# Patient Record
Sex: Male | Born: 1990 | Race: White | Hispanic: No | Marital: Single | Smoking: Former smoker
Health system: Southern US, Community
[De-identification: ages and names within clinical notes are randomized; demographics above are authoritative.]

## PROBLEM LIST (undated history)

## (undated) DIAGNOSIS — M5136 Other intervertebral disc degeneration, lumbar region: Secondary | ICD-10-CM

## (undated) HISTORY — PX: WISDOM TOOTH EXTRACTION: SHX21

---
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
## 2017-05-13 ENCOUNTER — Other Ambulatory Visit: Payer: Self-pay

## 2017-05-13 ENCOUNTER — Emergency Department (HOSPITAL_COMMUNITY)
Admission: EM | Admit: 2017-05-13 | Discharge: 2017-05-14 | Disposition: A | Discharge status: Home / Self Care | Payer: Self-pay | Attending: Emergency Medicine | Admitting: Emergency Medicine

## 2017-05-13 ENCOUNTER — Encounter (HOSPITAL_COMMUNITY): Payer: Self-pay | Admitting: Student

## 2017-05-13 DIAGNOSIS — Z87891 Personal history of nicotine dependence: Secondary | ICD-10-CM | POA: Insufficient documentation

## 2017-05-13 DIAGNOSIS — Y9389 Activity, other specified: Secondary | ICD-10-CM | POA: Insufficient documentation

## 2017-05-13 DIAGNOSIS — X509XXA Other and unspecified overexertion or strenuous movements or postures, initial encounter: Secondary | ICD-10-CM | POA: Insufficient documentation

## 2017-05-13 DIAGNOSIS — Y92017 Garden or yard in single-family (private) house as the place of occurrence of the external cause: Secondary | ICD-10-CM | POA: Insufficient documentation

## 2017-05-13 DIAGNOSIS — Y999 Unspecified external cause status: Secondary | ICD-10-CM | POA: Insufficient documentation

## 2017-05-13 DIAGNOSIS — S93402A Sprain of unspecified ligament of left ankle, initial encounter: Secondary | ICD-10-CM | POA: Insufficient documentation

## 2017-05-13 HISTORY — DX: Other intervertebral disc degeneration, lumbar region: M51.36

## 2017-05-13 NOTE — ED Triage Notes (Signed)
Pt c/o left ankle pain that started tonight after jumped over a lawn mower and landed wrong. Felt a pop.

## 2017-05-14 ENCOUNTER — Emergency Department (HOSPITAL_COMMUNITY): Payer: Self-pay

## 2017-05-14 MED ORDER — OXYCODONE-ACETAMINOPHEN 5-325 MG PO TABS
1.0000 | ORAL_TABLET | Freq: Once | ORAL | Status: AC
  Administered 2017-05-14: 1 via ORAL
  Filled 2017-05-14: qty 1

## 2017-05-14 MED ORDER — IBUPROFEN 800 MG PO TABS
ORAL_TABLET | ORAL | Status: AC
  Filled 2017-05-14: qty 1

## 2017-05-14 MED ORDER — IBUPROFEN 800 MG PO TABS: 800.0000 mg | ORAL_TABLET | Freq: Three times a day (TID) | ORAL | 0 refills | Status: AC | PRN

## 2017-05-14 MED ORDER — IBUPROFEN 400 MG PO TABS
400.0000 mg | ORAL_TABLET | Freq: Once | ORAL | Status: AC
  Administered 2017-05-14: 400 mg via ORAL

## 2017-05-14 NOTE — ED Provider Notes (Signed)
[facility] EMERGENCY DEPARTMENT Provider Note   CSN: [id number] Arrival date & time: 05/13/17  2343     History   Chief Complaint Chief Complaint  Patient presents with  . Ankle Pain    HPI Manuel Reid is a 27 y.o. male.  Patient presents with left ankle pain and foot pain after trying to jump over a riding lawnmower landing wrong.  His ankle inverted.  This happened around 4 PM.  He has been struggling to walk since.  He is not taking any medication for it.  Denies any other injury.  No head, neck, back or chest pain.  No numbness, weakness, tingling.  No open wounds.   The history is provided by the patient.  Ankle Pain      Past Medical History:  Diagnosis Date  . DDD (degenerative disc disease), lumbar     There are no active problems to display for this patient.   Past Surgical History:  Procedure Laterality Date  . WISDOM TOOTH EXTRACTION         Home Medications    Prior to Admission medications   Not on File    Family History History reviewed. No pertinent family history.  Social History Social History   Tobacco Use  . Smoking status: Former Games developer  . Smokeless tobacco: Never Used  Substance Use Topics  . Alcohol use: Yes    Frequency: Never    Comment: OCC  . Drug use: Yes    Types: Marijuana    Comment: OCC     Allergies   Patient has no allergy information on record.   Review of Systems Review of Systems  Constitutional: Negative for activity change, appetite change and fever.  HENT: Negative for congestion and rhinorrhea.   Respiratory: Negative for chest tightness.   Cardiovascular: Negative for chest pain.  Gastrointestinal: Negative for abdominal pain, nausea and vomiting.  Genitourinary: Negative for discharge and dysuria.  Musculoskeletal: Positive for arthralgias and myalgias. Negative for back pain and neck pain.  Skin: Negative for rash.  Neurological: Negative for dizziness, weakness and headaches.    all other  systems are negative except as noted in the HPI and PMH.    Physical Exam Updated Vital Signs BP 117/71 (BP Location: Right Arm)   Pulse 100   Temp 98.3 F (36.8 C) (Oral)   Resp 16   Ht 5\' 9"  (1.753 m)   Wt 97.5 kg (215 lb)   SpO2 99%   BMI 31.75 kg/m   Physical Exam  Constitutional: He is oriented to person, place, and time. He appears well-developed and well-nourished. No distress.  HENT:  Head: Normocephalic and atraumatic.  Mouth/Throat: Oropharynx is clear and moist. No oropharyngeal exudate.  Eyes: Conjunctivae and EOM are normal. Pupils are equal, round, and reactive to light.  Neck: Normal range of motion. Neck supple.  No meningismus.  Cardiovascular: Normal rate, regular rhythm, normal heart sounds and intact distal pulses.  No murmur heard. Pulmonary/Chest: Effort normal and breath sounds normal. No respiratory distress.  Abdominal: Soft. There is no tenderness. There is no rebound and no guarding.  Musculoskeletal: He exhibits edema and tenderness.  Diffuse swelling of L ankle, TTP medial and lateral malleoli Intact DP and PT pulse No proximal fibula tenderness Achilles intact.  Reduced ROM of ankle joint, able to wiggle toes  Neurological: He is alert and oriented to person, place, and time. No cranial nerve deficit. He exhibits normal muscle tone. Coordination normal.  No ataxia on  finger to nose bilaterally. No pronator drift. 5/5 strength throughout. CN 2-12 intact.Equal grip strength. Sensation intact.   Skin: Skin is warm.  Psychiatric: He has a normal mood and affect. His behavior is normal.  Nursing note and vitals reviewed.    ED Treatments / Results  Labs (all labs ordered are listed, but only abnormal results are displayed) Labs Reviewed - No data to display  EKG  EKG Interpretation None       Radiology Dg Ankle Complete Left  Result Date: 05/14/2017 CLINICAL DATA:  27 y/o M; rolling injury of left ankle with severe pain. EXAM: LEFT  ANKLE COMPLETE - 3+ VIEW COMPARISON:  None. FINDINGS: There is no evidence of fracture, dislocation, or joint effusion. There is no evidence of arthropathy or other focal bone abnormality. Soft tissues are unremarkable. IMPRESSION: Negative. Electronically Signed   By: Mitzi HansenLance  Furusawa-Stratton M.D.   On: 05/14/2017 01:01   Dg Foot Complete Left  Result Date: 05/14/2017 CLINICAL DATA:  27 y/o  M; rolling injury of left ankle with pain. EXAM: LEFT FOOT - COMPLETE 3+ VIEW COMPARISON:  None. FINDINGS: There is no evidence of fracture or dislocation. There is no evidence of arthropathy or other focal bone abnormality. Soft tissues are unremarkable. IMPRESSION: Negative. Electronically Signed   By: Mitzi HansenLance  Furusawa-Stratton M.D.   On: 05/14/2017 01:02    Procedures Procedures (including critical care time)  Medications Ordered in ED Medications  ibuprofen (ADVIL,MOTRIN) 800 MG tablet (not administered)  oxyCODONE-acetaminophen (PERCOCET/ROXICET) 5-325 MG per tablet 1 tablet (1 tablet Oral Given 05/14/17 0011)  ibuprofen (ADVIL,MOTRIN) tablet 400 mg (400 mg Oral Given 05/14/17 0011)     Initial Impression / Assessment and Plan / ED Course  I have reviewed the triage vital signs and the nursing notes.  Pertinent labs & imaging results that were available during my care of the patient were reviewed by me and considered in my medical decision making (see chart for details).    Left ankle pain after jumping over a lawnmower and inverting his ankle.  No other injury.  Neurovascularly intact.  X-rays are negative for fracture.  Patient given ice and anti-inflammatories.  Will give ASO and crutches.  Discussed rice, NSAIDs, orthopedic follow-up for ankle sprain.  Return precautions discussed. Final Clinical Impressions(s) / ED Diagnoses   Final diagnoses:  Sprain of left ankle, unspecified ligament, initial encounter    ED Discharge Orders    None       Tiyonna Sardinha, Jeannett SeniorStephen, MD 05/14/17 0207

## 2019-08-29 IMAGING — DX DG FOOT COMPLETE 3+V*L*
3 series · 3 of 3 positions shown · non-contrast
Comparison: None.

CLINICAL DATA: 26 y/o  M; rolling injury of left ankle with pain.

EXAM:
LEFT FOOT - COMPLETE 3+ VIEW

[foot ap]
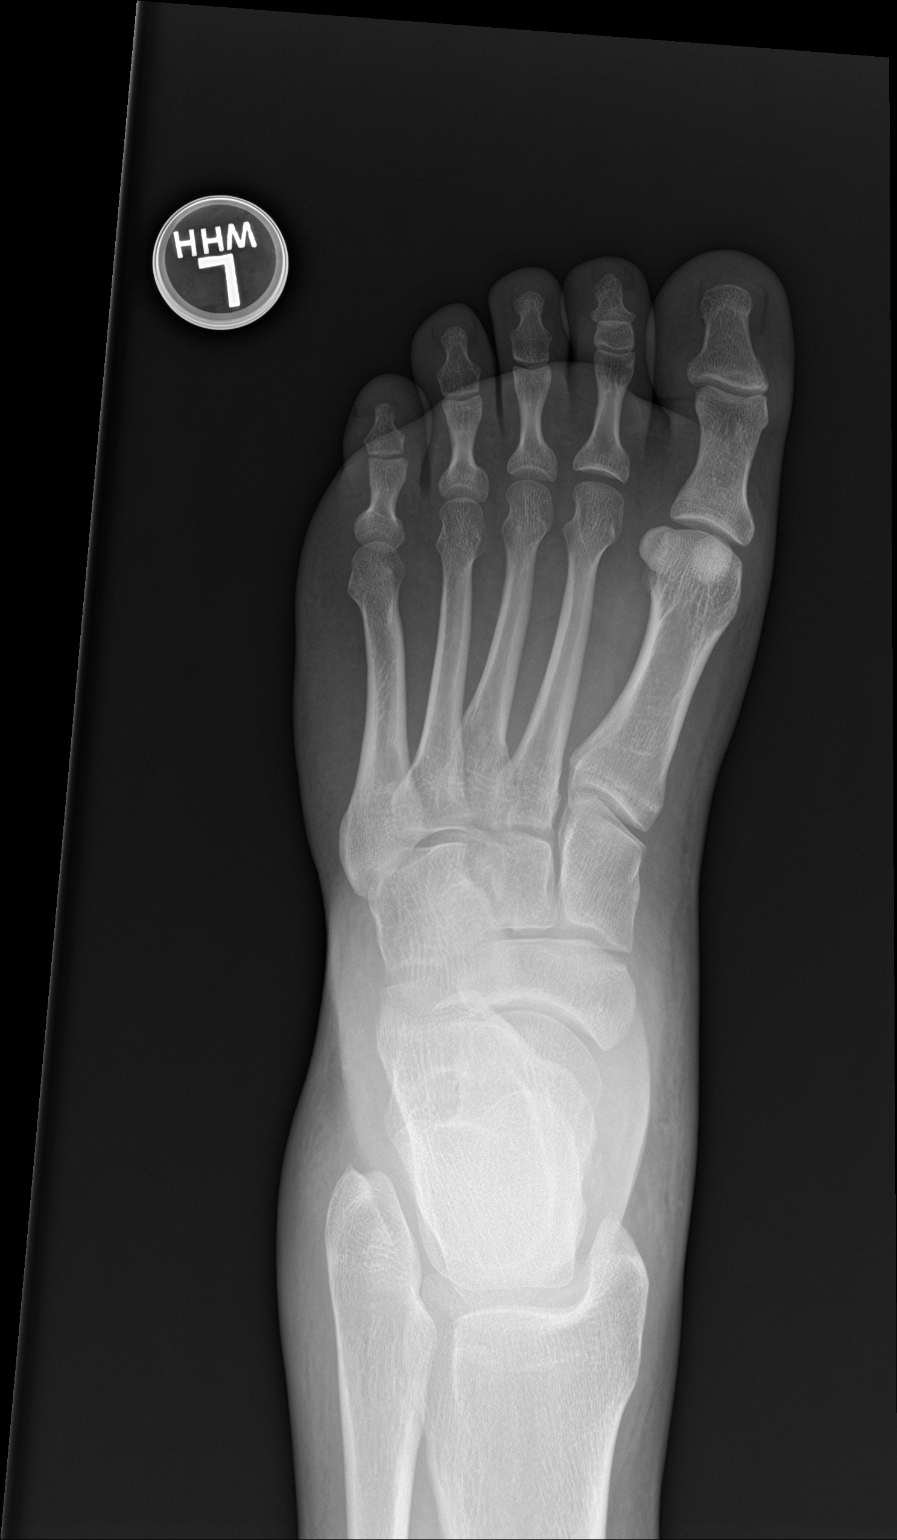

[foot obl]
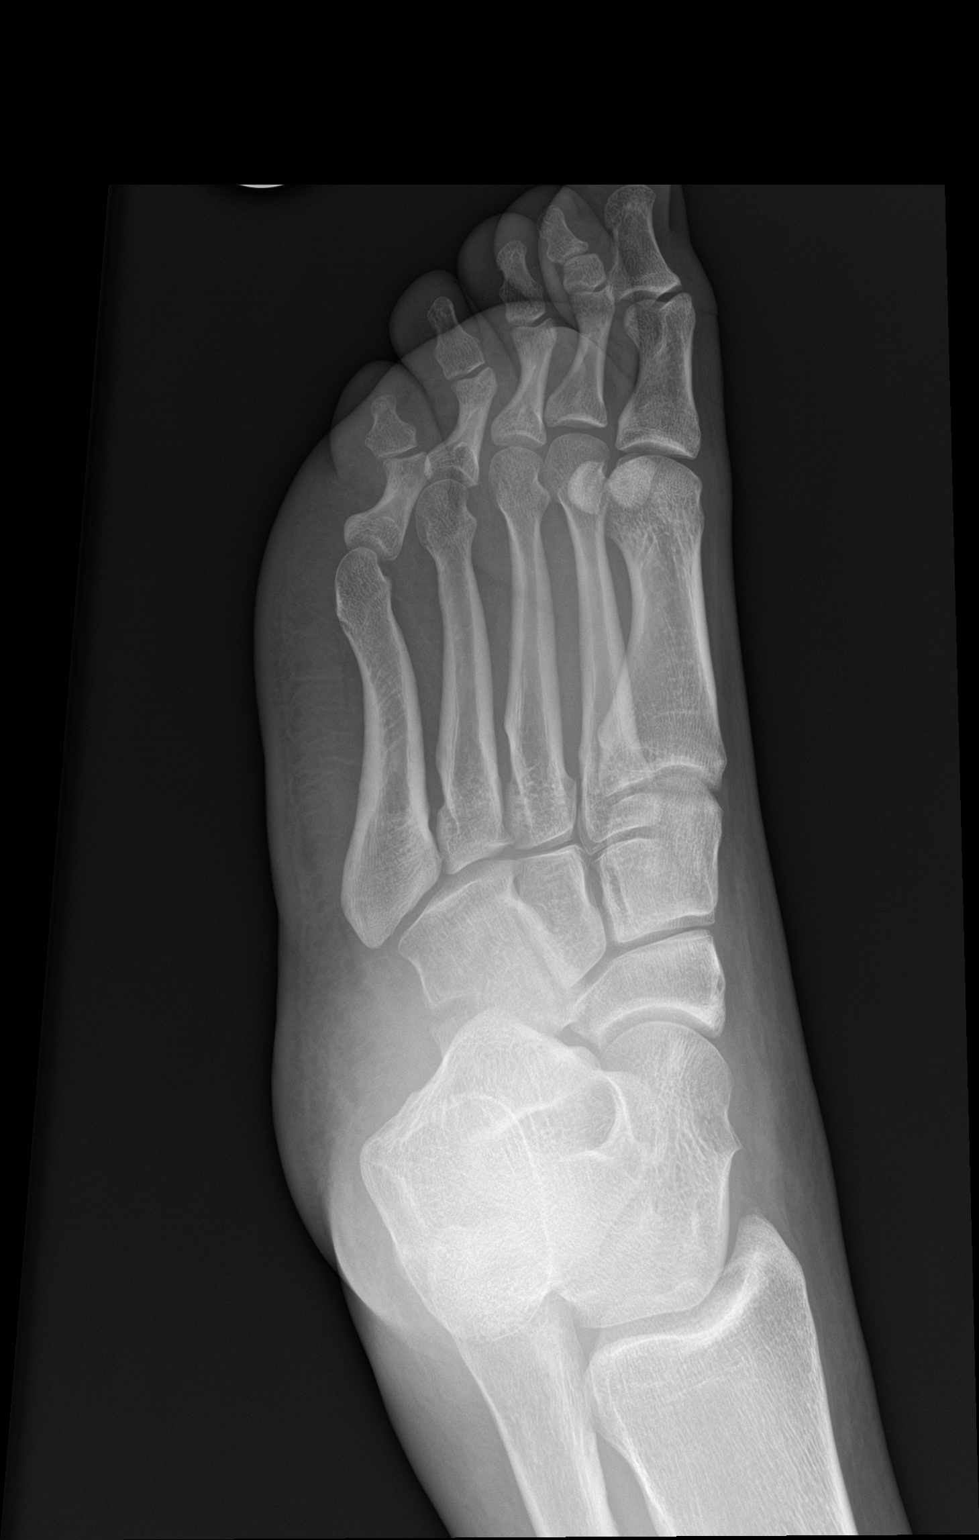

[foot lat]
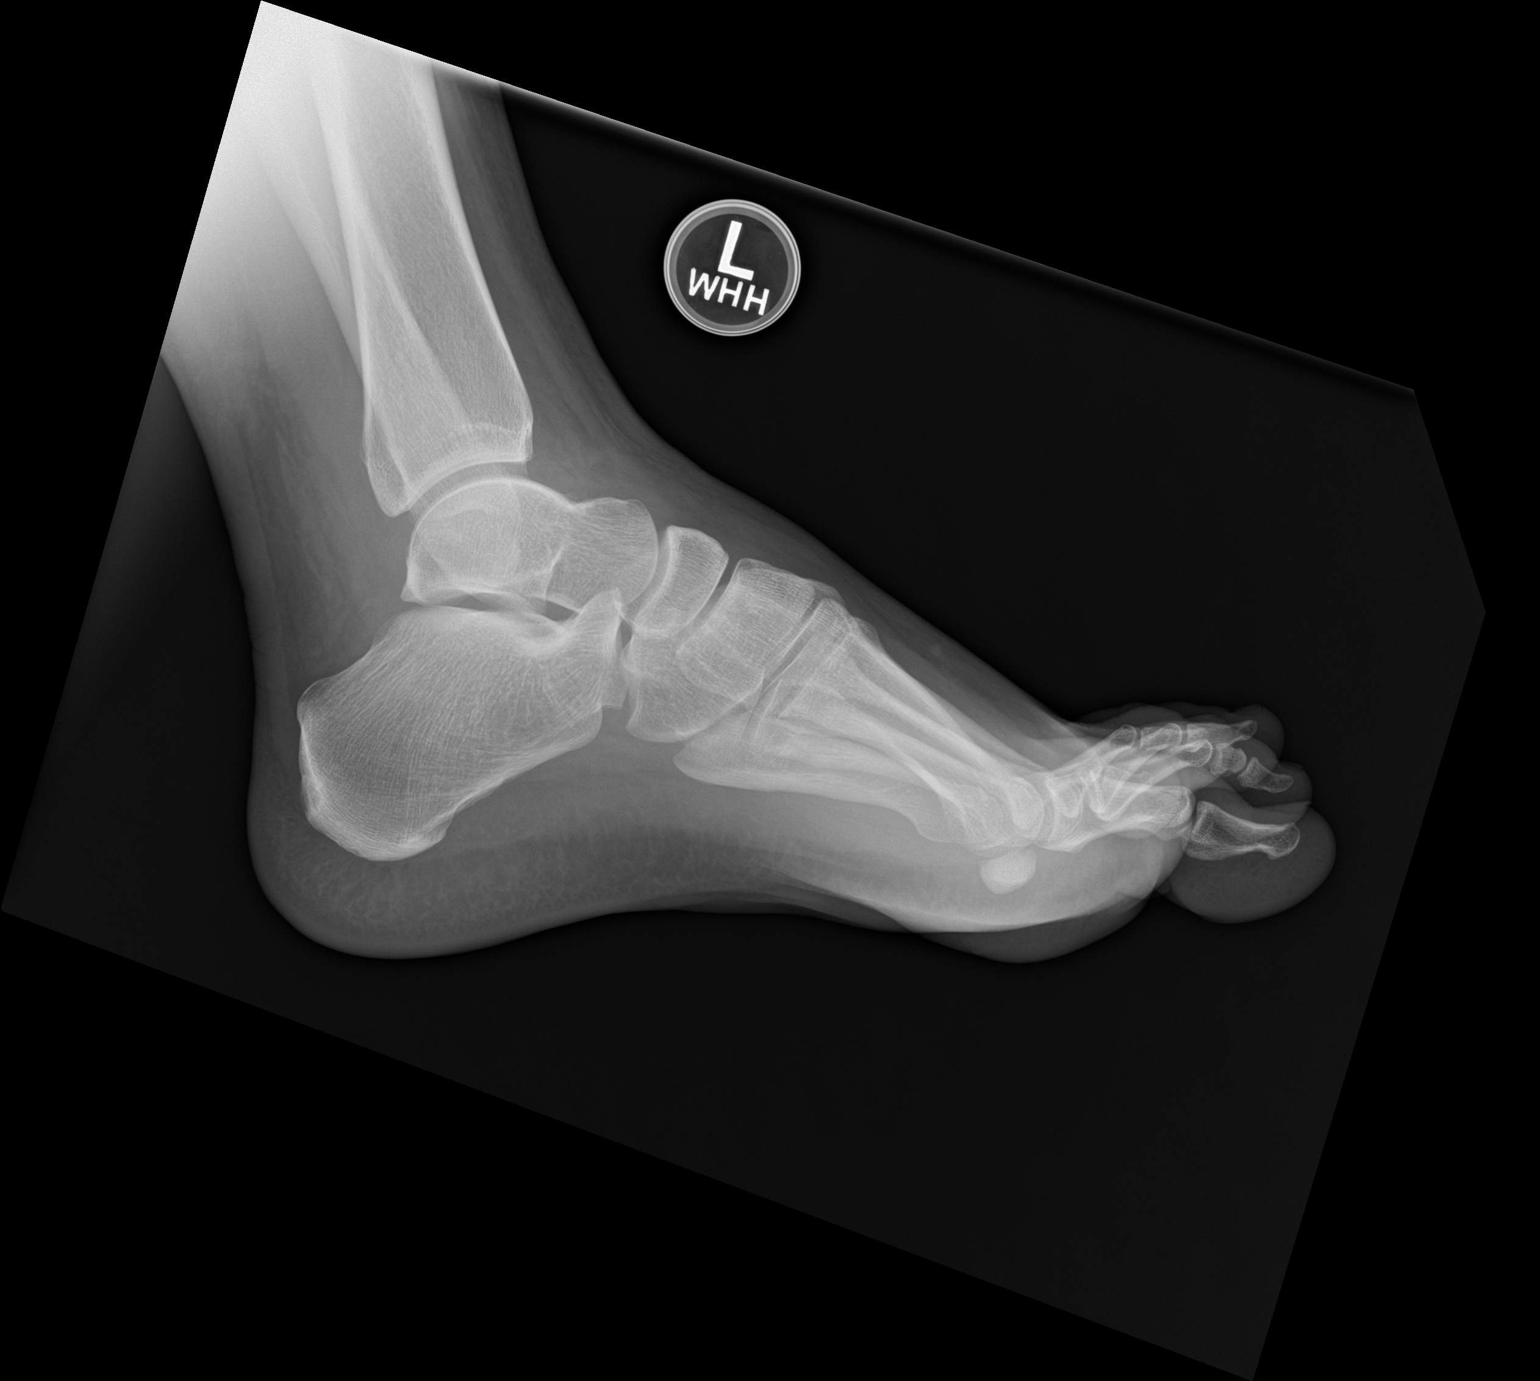

[3 of 3 positions shown; findings below may reference images not displayed]

FINDINGS: There is no evidence of fracture or dislocation. There is no
evidence of arthropathy or other focal bone abnormality. Soft
tissues are unremarkable.
IMPRESSION: Negative.

By: Jhosep Bedoy M.D.
